# Patient Record
Sex: Female | Born: 1950
Health system: Southern US, Community
[De-identification: ages and names within clinical notes are randomized; demographics above are authoritative.]

## PROBLEM LIST (undated history)

## (undated) DIAGNOSIS — R7989 Other specified abnormal findings of blood chemistry: Secondary | ICD-10-CM

## (undated) DIAGNOSIS — F32A Depression, unspecified: Secondary | ICD-10-CM

## (undated) DIAGNOSIS — F329 Major depressive disorder, single episode, unspecified: Secondary | ICD-10-CM

## (undated) DIAGNOSIS — F419 Anxiety disorder, unspecified: Secondary | ICD-10-CM

## (undated) HISTORY — PX: COLONOSCOPY W/ BIOPSIES AND POLYPECTOMY: SHX1376

## (undated) HISTORY — PX: TONSILLECTOMY: SUR1361

---

## 2000-09-18 ENCOUNTER — Other Ambulatory Visit: Admission: RE | Admit: 2000-09-18 | Discharge: 2000-09-18 | Payer: Self-pay | Admitting: *Deleted

## 2000-10-01 ENCOUNTER — Encounter: Payer: Self-pay | Admitting: Family Medicine

## 2000-10-01 ENCOUNTER — Encounter: Admission: RE | Admit: 2000-10-01 | Discharge: 2000-10-01 | Payer: Self-pay | Admitting: Family Medicine

## 2000-10-02 ENCOUNTER — Encounter: Admission: RE | Admit: 2000-10-02 | Discharge: 2000-10-02 | Payer: Self-pay | Admitting: Family Medicine

## 2000-10-02 ENCOUNTER — Encounter: Payer: Self-pay | Admitting: Family Medicine

## 2000-11-08 ENCOUNTER — Ambulatory Visit (HOSPITAL_COMMUNITY): Admission: RE | Admit: 2000-11-08 | Discharge: 2000-11-08 | Payer: Self-pay | Admitting: Gastroenterology

## 2001-08-16 ENCOUNTER — Emergency Department (HOSPITAL_COMMUNITY): Admission: EM | Admit: 2001-08-16 | Discharge: 2001-08-16 | Payer: Self-pay | Admitting: Emergency Medicine

## 2001-08-28 ENCOUNTER — Encounter: Admission: RE | Admit: 2001-08-28 | Discharge: 2001-08-28 | Payer: Self-pay | Admitting: Occupational Medicine

## 2001-08-28 ENCOUNTER — Encounter: Payer: Self-pay | Admitting: Occupational Medicine

## 2004-05-23 ENCOUNTER — Encounter: Admission: RE | Admit: 2004-05-23 | Discharge: 2004-05-23 | Payer: Self-pay | Admitting: Family Medicine

## 2004-06-09 ENCOUNTER — Other Ambulatory Visit: Admission: RE | Admit: 2004-06-09 | Discharge: 2004-06-09 | Payer: Self-pay | Admitting: Family Medicine

## 2005-09-18 ENCOUNTER — Emergency Department (HOSPITAL_COMMUNITY): Admission: EM | Admit: 2005-09-18 | Discharge: 2005-09-18 | Payer: Self-pay | Admitting: Emergency Medicine

## 2007-07-30 ENCOUNTER — Ambulatory Visit (HOSPITAL_COMMUNITY): Admission: RE | Admit: 2007-07-30 | Discharge: 2007-07-30 | Payer: Self-pay | Admitting: Gastroenterology

## 2007-07-30 ENCOUNTER — Encounter (INDEPENDENT_AMBULATORY_CARE_PROVIDER_SITE_OTHER): Payer: Self-pay | Admitting: Gastroenterology

## 2008-04-16 ENCOUNTER — Emergency Department (HOSPITAL_COMMUNITY): Admission: EM | Admit: 2008-04-16 | Discharge: 2008-04-16 | Payer: Self-pay | Admitting: Emergency Medicine

## 2008-08-26 ENCOUNTER — Ambulatory Visit (HOSPITAL_COMMUNITY): Admission: RE | Admit: 2008-08-26 | Discharge: 2008-08-26 | Payer: Self-pay | Admitting: Gastroenterology

## 2009-07-17 ENCOUNTER — Emergency Department (HOSPITAL_COMMUNITY): Admission: EM | Admit: 2009-07-17 | Discharge: 2009-07-17 | Payer: Self-pay | Admitting: Emergency Medicine

## 2010-12-20 NOTE — Op Note (Signed)
NAME:  Carol Anderson, Carol Anderson           ACCOUNT NO.:  [id number]   MEDICAL RECORD NO.:  [id number]          PATIENT TYPE:  AMB   LOCATION:  ENDO                         FACILITY:  [facility]   PHYSICIAN:  Graylin Shiver, M.D.   DATE OF BIRTH:  12-09-1950   DATE OF PROCEDURE:  07/30/2007  DATE OF DISCHARGE:                               OPERATIVE REPORT   PROCEDURE:  Colonoscopy with polypectomy.   INDICATIONS FOR PROCEDURE:  Rectal bleeding, family history of colon  cancer.   Informed consent was obtained after explanation of the risks of  bleeding, infection and perforation.   PREMEDICATION:  Fentanyl 100 mcg IV, Versed 10 mg IV.   PROCEDURE:  With the patient in the left lateral decubitus position, a  rectal exam was performed.  No masses were felt.  The Pentax colonoscope  was inserted into the rectum and advanced around the colon to the cecum.  Cecal landmarks were identified.  The cecum and ascending colon were  normal.  The transverse colon was normal.  The descending colon and  sigmoid showed a few small diverticula.  In the sigmoid colon there was  a 1 cm pedunculated polyp that was snared and removed by snare cautery  technique.  Polyp was retrieved.  The cautery site looked good.  Also in  the sigmoid colon were two small sessile polyps approximately 5 mm in  size that were snared and removed by snare cautery technique.  The  rectum looked normal.  The scope was retroflexed in the rectum.  No  abnormalities were seen.  She tolerated the procedure well without  complications.   IMPRESSION:  1. Colon polyps.  2. Diverticulosis.   PLAN:  The pathology will be checked.           ______________________________  Graylin Shiver, M.D.     SFG/MEDQ  D:  07/30/2007  T:  07/30/2007  Job:  [id number]   cc:   L. Lupe Carney, M.D.  Bryan Lemma. Manus Gunning, M.D.

## 2010-12-20 NOTE — Op Note (Signed)
NAME:  Carol Anderson, Carol Anderson NO.:  [id number]   MEDICAL RECORD NO.:  [id number]          PATIENT TYPE:  AMB   LOCATION:  ENDO                         FACILITY:  [facility]   PHYSICIAN:  Graylin Shiver, M.D.   DATE OF BIRTH:  10/26/1950   DATE OF PROCEDURE:  08/26/2008  DATE OF DISCHARGE:                               OPERATIVE REPORT   SURGEON:  Graylin Shiver, MD   INDICATION:  History of a cancerous colon polyp, endoscopically resected  in December 2008.   Informed consent was obtained after explanation of the risks of  bleeding, infection, and perforation.   PREMEDICATIONS:  1. Fentanyl 100 mcg IV.  2. Versed 7 mg IV.   PROCEDURE:  With the patient in the left lateral decubitus position, a  rectal exam was performed.  No masses were felt.  The Pentax colonoscope  was inserted into the rectum and advanced around the colon to the cecum.  Cecal landmarks were identified.  The cecum and ascending colon were  normal.  The transverse colon normal.  The descending colon looked  normal.  In the sigmoid, there were a few small diverticula in the  sigmoid, otherwise, looked normal with no evidence of polyps or  recurrence in that area.  The rectum looked normal.  Scope was  retroflexed in the rectum and looked normal.  The scope was removed.  She tolerated the procedure well without complications.   IMPRESSION:  1. Diverticulosis.  2. No evidence of recurring polyps.   RECOMMENDATIONS:  I would recommend repeat colonoscopy in 3 years.           ______________________________  Graylin Shiver, M.D.     SFG/MEDQ  D:  08/26/2008  T:  08/26/2008  Job:  [id number]   cc:   L. Lupe Carney, M.D.  Bryan Lemma. Manus Gunning, M.D.

## 2010-12-23 NOTE — Procedures (Signed)
[place]  Patient:    Carol Anderson, Carol Anderson                  MRN: [id number] Proc. Date: 11/08/00 Adm. Date:  [id number] Attending:  Charna Elizabeth CC:         Katy Fitch, M.D.   Procedure Report  DATE OF BIRTH:  1950-09-12  PROCEDURE PERFORMED:  Colonoscopy.  ENDOSCOPIST:  Anselmo Rod, M.D.  INSTRUMENT USED:  Olympus video colonoscope.  INDICATION FOR PROCEDURE:  Family history of colon cancer in this 60 year old white female, rule out colonic polyps, masses, hemorrhoids, etc.  PREPROCEDURE PREPARATION:  Informed consent was procured from the patient. The patient was fasted for eight hours prior to the procedure and prepped with a bottle of magnesium citrate and a gallon of NuLytely the night prior to the procedure.  PREPROCEDURE PHYSICAL:  VITAL SIGNS:  The patient had stable vital signs.  NECK:  Supple.  CHEST:  Clear to auscultation.  S1 and S2 regular.  ABDOMEN:  Soft with normal abdominal bowel sounds.  DESCRIPTION OF PROCEDURE:  The patient was placed in the left lateral decubitus position and sedated with 50 mg of Demerol and 5 mg of Versed intravenously.  Once the patient was adequately sedated and maintained on low flow oxygen, continuous cardiac monitoring, the Olympus video colonoscope was advanced from the rectum to the cecum without difficulty.  The entire colonic mucosa appeared healthy and without lesions.  No masses, polyps, erosions, ulcerations or diverticula were seen.  IMPRESSION:  Normal colonoscopy.  RECOMMENDATIONS:  Repeat colorectal cancer screening in the next five years or earlier if the patient develops any abdominal symptoms in the interim. DD:  11/09/00 TD:  11/09/00 Job: [id number]

## 2013-04-03 ENCOUNTER — Other Ambulatory Visit (HOSPITAL_COMMUNITY)
Admission: RE | Admit: 2013-04-03 | Discharge: 2013-04-03 | Disposition: A | Discharge status: Home / Self Care | Payer: 59 | Source: Ambulatory Visit | Attending: Obstetrics and Gynecology | Admitting: Obstetrics and Gynecology

## 2013-04-03 ENCOUNTER — Other Ambulatory Visit: Payer: Self-pay | Admitting: Obstetrics and Gynecology

## 2013-04-03 ENCOUNTER — Other Ambulatory Visit: Payer: Self-pay

## 2013-04-03 DIAGNOSIS — Z01419 Encounter for gynecological examination (general) (routine) without abnormal findings: Secondary | ICD-10-CM | POA: Insufficient documentation

## 2013-04-03 DIAGNOSIS — Z1231 Encounter for screening mammogram for malignant neoplasm of breast: Secondary | ICD-10-CM

## 2013-04-03 DIAGNOSIS — Z1151 Encounter for screening for human papillomavirus (HPV): Secondary | ICD-10-CM | POA: Insufficient documentation

## 2013-04-25 ENCOUNTER — Ambulatory Visit
Admission: RE | Admit: 2013-04-25 | Discharge: 2013-04-25 | Disposition: A | Discharge status: Home / Self Care | Payer: 59 | Source: Ambulatory Visit

## 2013-04-25 DIAGNOSIS — Z1231 Encounter for screening mammogram for malignant neoplasm of breast: Secondary | ICD-10-CM

## 2013-04-30 ENCOUNTER — Other Ambulatory Visit: Payer: Self-pay | Admitting: Gastroenterology

## 2013-11-03 ENCOUNTER — Ambulatory Visit
Admission: RE | Admit: 2013-11-03 | Discharge: 2013-11-03 | Disposition: A | Discharge status: Home / Self Care | Payer: 59 | Source: Ambulatory Visit | Attending: Family Medicine | Admitting: Family Medicine

## 2013-11-03 ENCOUNTER — Other Ambulatory Visit: Payer: Self-pay | Admitting: Family Medicine

## 2013-11-03 DIAGNOSIS — M79609 Pain in unspecified limb: Secondary | ICD-10-CM

## 2014-03-26 ENCOUNTER — Emergency Department (HOSPITAL_COMMUNITY): Payer: 59

## 2014-03-26 ENCOUNTER — Encounter (HOSPITAL_COMMUNITY): Payer: Self-pay | Admitting: Emergency Medicine

## 2014-03-26 ENCOUNTER — Emergency Department (HOSPITAL_COMMUNITY)
Admission: EM | Admit: 2014-03-26 | Discharge: 2014-03-27 | Disposition: A | Discharge status: Home / Self Care | Payer: 59 | Attending: Emergency Medicine | Admitting: Emergency Medicine

## 2014-03-26 DIAGNOSIS — G43109 Migraine with aura, not intractable, without status migrainosus: Secondary | ICD-10-CM | POA: Insufficient documentation

## 2014-03-26 DIAGNOSIS — Z8659 Personal history of other mental and behavioral disorders: Secondary | ICD-10-CM | POA: Diagnosis not present

## 2014-03-26 DIAGNOSIS — R209 Unspecified disturbances of skin sensation: Secondary | ICD-10-CM | POA: Diagnosis present

## 2014-03-26 DIAGNOSIS — R200 Anesthesia of skin: Secondary | ICD-10-CM

## 2014-03-26 HISTORY — DX: Other specified abnormal findings of blood chemistry: R79.89

## 2014-03-26 HISTORY — DX: Major depressive disorder, single episode, unspecified: F32.9

## 2014-03-26 HISTORY — DX: Anxiety disorder, unspecified: F41.9

## 2014-03-26 HISTORY — DX: Depression, unspecified: F32.A

## 2014-03-26 LAB — DIFFERENTIAL
Basophils Absolute: 0.1 10*3/uL (ref 0.0–0.1)
Basophils Relative: 1 % (ref 0–1)
Eosinophils Absolute: 0.2 10*3/uL (ref 0.0–0.7)
Eosinophils Relative: 2 % (ref 0–5)
Lymphocytes Relative: 32 % (ref 12–46)
Lymphs Abs: 2.4 10*3/uL (ref 0.7–4.0)
Monocytes Absolute: 0.6 10*3/uL (ref 0.1–1.0)
Monocytes Relative: 8 % (ref 3–12)
Neutro Abs: 4.2 10*3/uL (ref 1.7–7.7)
Neutrophils Relative %: 57 % (ref 43–77)

## 2014-03-26 LAB — CBC
HCT: 42.1 % (ref 36.0–46.0)
Hemoglobin: 13.7 g/dL (ref 12.0–15.0)
MCH: 26 pg (ref 26.0–34.0)
MCHC: 32.5 g/dL (ref 30.0–36.0)
MCV: 79.9 fL (ref 78.0–100.0)
Platelets: 247 10*3/uL (ref 150–400)
RBC: 5.27 MIL/uL — ABNORMAL HIGH (ref 3.87–5.11)
RDW: 15.8 % — ABNORMAL HIGH (ref 11.5–15.5)
WBC: 7.4 10*3/uL (ref 4.0–10.5)

## 2014-03-26 LAB — COMPREHENSIVE METABOLIC PANEL
ALT: 21 U/L (ref 0–35)
AST: 20 U/L (ref 0–37)
Albumin: 3.5 g/dL (ref 3.5–5.2)
Alkaline Phosphatase: 78 U/L (ref 39–117)
Anion gap: 14 (ref 5–15)
BUN: 13 mg/dL (ref 6–23)
CO2: 23 mEq/L (ref 19–32)
Calcium: 9.3 mg/dL (ref 8.4–10.5)
Chloride: 106 mEq/L (ref 96–112)
Creatinine, Ser: 1.01 mg/dL (ref 0.50–1.10)
GFR calc Af Amer: 68 mL/min — ABNORMAL LOW (ref >90)
GFR calc non Af Amer: 59 mL/min — ABNORMAL LOW (ref >90)
Glucose, Bld: 114 mg/dL — ABNORMAL HIGH (ref 70–99)
Potassium: 4.2 mEq/L (ref 3.7–5.3)
Sodium: 143 mEq/L (ref 137–147)
Total Bilirubin: <0.2 mg/dL — ABNORMAL LOW (ref 0.3–1.2)
Total Protein: 6.8 g/dL (ref 6.0–8.3)

## 2014-03-26 LAB — I-STAT CHEM 8, ED
BUN: 14 mg/dL (ref 6–23)
Calcium, Ion: 1.13 mmol/L (ref 1.13–1.30)
Chloride: 109 mEq/L (ref 96–112)
Creatinine, Ser: 1.2 mg/dL — ABNORMAL HIGH (ref 0.50–1.10)
Glucose, Bld: 117 mg/dL — ABNORMAL HIGH (ref 70–99)
HCT: 44 % (ref 36.0–46.0)
Hemoglobin: 15 g/dL (ref 12.0–15.0)
Potassium: 3.9 mEq/L (ref 3.7–5.3)
Sodium: 140 mEq/L (ref 137–147)
TCO2: 25 mmol/L (ref 0–100)

## 2014-03-26 LAB — PROTIME-INR
INR: 0.92 (ref 0.00–1.49)
Prothrombin Time: 12.4 seconds (ref 11.6–15.2)

## 2014-03-26 LAB — ETHANOL: Alcohol, Ethyl (B): <11 mg/dL (ref 0–11)

## 2014-03-26 LAB — APTT: aPTT: 27 seconds (ref 24–37)

## 2014-03-26 LAB — I-STAT TROPONIN, ED: Troponin i, poc: 0 ng/mL (ref 0.00–0.08)

## 2014-03-26 MED ORDER — PROCHLORPERAZINE EDISYLATE 5 MG/ML IJ SOLN
10.0000 mg | Freq: Four times a day (QID) | INTRAMUSCULAR | Status: DC | PRN
  Administered 2014-03-26: 10 mg via INTRAVENOUS
  Filled 2014-03-26: qty 2

## 2014-03-26 MED ORDER — KETOROLAC TROMETHAMINE 15 MG/ML IJ SOLN
15.0000 mg | Freq: Once | INTRAMUSCULAR | Status: AC
  Administered 2014-03-26: 15 mg via INTRAVENOUS
  Filled 2014-03-26: qty 1

## 2014-03-26 MED ORDER — DIPHENHYDRAMINE HCL 50 MG/ML IJ SOLN
25.0000 mg | Freq: Once | INTRAMUSCULAR | Status: AC
  Administered 2014-03-26: 25 mg via INTRAVENOUS
  Filled 2014-03-26: qty 1

## 2014-03-26 NOTE — Code Documentation (Signed)
63 yo wf presenting to triage with one day hx of intermittent HA, dizziness, blurred & double vision that progressed to Lt facial tingling & mild LUE drift.  Pt denies hx of migraines but states her mother has them.  NIH 0 currently & pt denies any visual issues now.  Still with HA.  Plan for MRI.

## 2014-03-26 NOTE — ED Notes (Signed)
Assisted patient to the restroom without any difficulty or incident. 

## 2014-03-26 NOTE — ED Notes (Signed)
Pt presents with blurred vision and headache onset yesterday.  Today at 7pm pt reports numbness and tingling to left side of mouth, facial droop noted to left side- arm drift noted to left arm.  Code stroke called in triage.

## 2014-03-26 NOTE — Consult Note (Signed)
Neurology Consultation Reason for Consult: Left facial numbness Referring Physician: Juleen ChinaKohut, S  CC: Left facial numbness  History is obtained from: Patient  HPI: Carol Anderson is a 63 y.o. female with a history of migraines who presents with headache since yesterday as well as blurred vision and then today she began having some left facial tingling which prompted her to seek care in the emergency room. A code stroke was activated.  CT head was negative.  She reports the tingling is being on left side of her face and associated with numbness as well. She also notes bilateral blurred vision which is common gone over the past day. She has a throbbing headache associated with photophobia. She denies nausea/vomiting. She does have occasional migraines and a family history of migraines.   LKW: 8 AM yesterday tpa given?: no, outside of window, resolve symptoms    ROS: A 14 point ROS was performed and is negative except as noted in the HPI.   Past Medical History  Diagnosis Date  . Depression   . Anxiety   . Low serum vitamin D     Family History: Mother-migraines  Social History: Tob: Denies  Exam: Current vital signs: BP 142/89  Pulse 79  Temp(Src) 98 F (36.7 C) (Oral)  Resp 16  SpO2 94% Vital signs in last 24 hours: Temp:  [98 F (36.7 C)-98.4 F (36.9 C)] 98 F (36.7 C) (08/20 2117) Pulse Rate:  [76-101] 79 (08/20 2200) Resp:  [16-18] 16 (08/20 2100) BP: (142-162)/(58-96) 142/89 mmHg (08/20 2200) SpO2:  [90 %-99 %] 94 % (08/20 2200)  General: In bed, NAD CV: Regular rate and rhythm Mental Status: Patient is awake, alert, oriented to person, place, month, year, and situation. Immediate and remote memory are intact. Patient is able to give a clear and coherent history. No signs of aphasia or neglect Cranial Nerves: II: Visual Fields are full. Pupils are equal, round, and reactive to light.  Discs are difficult to visualize. III,IV, VI: EOMI without ptosis  or diploplia.  V: Facial sensation is symmetric to temperature VII: Facial movement is symmetric.  VIII: hearing is intact to voice X: Uvula elevates symmetrically XI: Shoulder shrug is symmetric. XII: tongue is midline without atrophy or fasciculations.  Motor: Tone is normal. Bulk is normal. 5/5 strength was present in all four extremities.  Sensory: Sensation is symmetric to light touch and temperature in the arms and legs. Deep Tendon Reflexes: 2+ and symmetric in the biceps and patellae.  Plantars: Toes are downgoing bilaterally.  Cerebellar: FNF and HKS are intact bilaterally Gait: Not tested due to the acute nature of evaluation and multiple medical monitors in ED setting.    I have reviewed labs in epic and the results pertinent to this consultation are: CMP, CBC-unremarkable  I have reviewed the images obtained: CT head-unremarkable  Impression: 63 year old female with blurred vision and tingling in the setting of throbbing headache. This is most likely consistent with migraine headache with aura. An MRI could be obtained to confirm this.  Recommendations: 1) MRI brain 2) if negative, would treat his complicated migraine with Compazine plus Toradol 3) no further workup of MRI brain is negative.   Ritta SlotMcNeill Kirkpatrick, MD [facility] [phone]  If 7pm- 7am, please page neurology on call as listed in AMION.

## 2014-03-27 NOTE — Discharge Instructions (Signed)
General Headache Without Cause  A general headache is pain or discomfort felt around the head or neck area. The cause may not be found.   HOME CARE   · Keep all doctor visits.  · Only take medicines as told by your doctor.  · Lie down in a dark, quiet room when you have a headache.  · Keep a journal to find out if certain things bring on headaches. For example, write down:  ¨ What you eat and drink.  ¨ How much sleep you get.  ¨ Any change to your diet or medicines.  · Relax by getting a massage or doing other relaxing activities.  · Put ice or heat packs on the head and neck area as told by your doctor.  · Lessen stress.  · Sit up straight. Do not tighten (tense) your muscles.  · Quit smoking if you smoke.  · Lessen how much alcohol you drink.  · Lessen how much caffeine you drink, or stop drinking caffeine.  · Eat and sleep on a regular schedule.  · Get 7 to 9 hours of sleep, or as told by your doctor.  · Keep lights dim if bright lights bother you or make your headaches worse.  GET HELP RIGHT AWAY IF:   · Your headache becomes really bad.  · You have a fever.  · You have a stiff neck.  · You have trouble seeing.  · Your muscles are weak, or you lose muscle control.  · You lose your balance or have trouble walking.  · You feel like you will pass out (faint), or you pass out.  · You have really bad symptoms that are different than your first symptoms.  · You have problems with the medicines given to you by your doctor.  · Your medicines do not work.  · Your headache feels different than the other headaches.  · You feel sick to your stomach (nauseous) or throw up (vomit).  MAKE SURE YOU:   · Understand these instructions.  · Will watch your condition.  · Will get help right away if you are not doing well or get worse.  Document Released: 05/02/2008 Document Revised: 10/16/2011 Document Reviewed: 07/14/2011  Exit[employer]® Patient Information ©2015 [employer]. This information is not intended to replace advice given to  you by your health care provider. Make sure you discuss any questions you have with your health care provider.

## 2014-04-01 NOTE — ED Provider Notes (Signed)
CSN: [id number]     Arrival date & time 03/26/14  2001 History   First MD Initiated Contact with Patient 03/26/14 2118     Chief Complaint  Patient presents with  . Code Stroke     (Consider location/radiation/quality/duration/timing/severity/associated sxs/prior Treatment) HPI  63 year old female with headache and some blurred vision. Symptom onset yesterday. Relatively constant. Today she developed some new symptoms of numbness and tingling to left side of her mouth felt like she cannot coordinate her left upper extremity normally. These symptoms began around 1900. No fevers or chills. No nausea vomiting. No change in her speech.   Past Medical History  Diagnosis Date  . Depression   . Anxiety   . Low serum vitamin D    Past Surgical History  Procedure Laterality Date  . Colonoscopy w/ biopsies and polypectomy    . Tonsillectomy     History reviewed. No pertinent family history. History  Substance Use Topics  . Smoking status: Never Smoker   . Smokeless tobacco: Never Used  . Alcohol Use: No   OB History   Grav Para Term Preterm Abortions TAB SAB Ect Mult Living                 Review of Systems  All systems reviewed and negative, other than as noted in HPI.  Allergies  Review of patient's allergies indicates not on file.  Home Medications   Prior to Admission medications   Not on File   BP 144/82  Pulse 79  Temp(Src) 98 F (36.7 C) (Oral)  Resp 16  SpO2 94% Physical Exam  Nursing note and vitals reviewed. Constitutional: She is oriented to person, place, and time. She appears well-developed and well-nourished. No distress.  HENT:  Head: Normocephalic and atraumatic.  Eyes: Conjunctivae are normal. Right eye exhibits no discharge. Left eye exhibits no discharge.  Neck: Neck supple.  Cardiovascular: Normal rate, regular rhythm and normal heart sounds.  Exam reveals no gallop and no friction rub.   No murmur heard. Pulmonary/Chest: Effort normal and  breath sounds normal. No respiratory distress.  Abdominal: Soft. She exhibits no distension. There is no tenderness.  Musculoskeletal: She exhibits no edema and no tenderness.  Neurological: She is alert and oriented to person, place, and time. No cranial nerve deficit. She exhibits normal muscle tone. Coordination normal.  Speech clear. Content is appropriate. Cranial nerves are intact. Follows commands. Strength is 5 out of 5 bilateral upper and lower extremities. Good finger to nose testing bilaterally.  Skin: Skin is warm and dry.  Psychiatric: She has a normal mood and affect. Her behavior is normal. Thought content normal.    ED Course  Procedures (including critical care time) Labs Review Labs Reviewed  CBC - Abnormal; Notable for the following:    RBC 5.27 (*)    RDW 15.8 (*)    All other components within normal limits  COMPREHENSIVE METABOLIC PANEL - Abnormal; Notable for the following:    Glucose, Bld 114 (*)    Total Bilirubin <0.2 (*)    GFR calc non Af Amer 59 (*)    GFR calc Af Amer 68 (*)    All other components within normal limits  I-STAT CHEM 8, ED - Abnormal; Notable for the following:    Creatinine, Ser 1.20 (*)    Glucose, Bld 117 (*)    All other components within normal limits  ETHANOL  PROTIME-INR  APTT  DIFFERENTIAL  I-STAT TROPOININ, ED  Rosezena Sensor, ED  Imaging Review No results found.   EKG Interpretation   Date/Time:  Thursday March 26 2014 21:36:57 EDT Ventricular Rate:  79 PR Interval:  180 QRS Duration: 88 QT Interval:  382 QTC Calculation: 438 R Axis:   47 Text Interpretation:  Normal sinus rhythm Cannot rule out Anterior infarct  , age undetermined Abnormal ECG ED PHYSICIAN INTERPRETATION AVAILABLE IN  [facility] Confirmed by TEST, Record ([id number]) on 03/28/2014 11:08:43 AM      MDM   Final diagnoses:  Complicated migraine    63 year old female with headache with an onset neurological symptoms. Evaluated by  neurology. Felt to be complicated migraine. CT and subsequent MRI normal. Symptoms have been improved. Patient discharged no acute distress. Return cautions were discussed.    Raeford Razor, MD 04/01/14 2255

## 2014-07-15 ENCOUNTER — Other Ambulatory Visit (HOSPITAL_COMMUNITY): Payer: Self-pay | Admitting: Family Medicine

## 2014-07-15 DIAGNOSIS — R1012 Left upper quadrant pain: Secondary | ICD-10-CM

## 2014-07-16 ENCOUNTER — Encounter (HOSPITAL_COMMUNITY): Payer: Self-pay

## 2014-07-16 ENCOUNTER — Ambulatory Visit (HOSPITAL_COMMUNITY)
Admission: RE | Admit: 2014-07-16 | Discharge: 2014-07-16 | Disposition: A | Discharge status: Home / Self Care | Payer: 59 | Source: Ambulatory Visit | Attending: Family Medicine | Admitting: Family Medicine

## 2014-07-16 DIAGNOSIS — R1012 Left upper quadrant pain: Secondary | ICD-10-CM | POA: Insufficient documentation

## 2014-07-16 MED ORDER — IOHEXOL 300 MG/ML  SOLN
100.0000 mL | Freq: Once | INTRAMUSCULAR | Status: AC | PRN
  Administered 2014-07-16: 100 mL via INTRAVENOUS

## 2014-07-17 ENCOUNTER — Ambulatory Visit (HOSPITAL_COMMUNITY): Payer: 59

## 2015-06-25 ENCOUNTER — Other Ambulatory Visit: Payer: Self-pay

## 2015-06-25 DIAGNOSIS — Z1231 Encounter for screening mammogram for malignant neoplasm of breast: Secondary | ICD-10-CM

## 2015-07-14 ENCOUNTER — Ambulatory Visit
Admission: RE | Admit: 2015-07-14 | Discharge: 2015-07-14 | Disposition: A | Discharge status: Home / Self Care | Payer: 59 | Source: Ambulatory Visit

## 2015-07-14 DIAGNOSIS — Z1231 Encounter for screening mammogram for malignant neoplasm of breast: Secondary | ICD-10-CM

## 2015-07-23 ENCOUNTER — Other Ambulatory Visit (HOSPITAL_COMMUNITY)
Admission: RE | Admit: 2015-07-23 | Discharge: 2015-07-23 | Disposition: A | Discharge status: Home / Self Care | Payer: 59 | Source: Ambulatory Visit | Attending: Obstetrics and Gynecology | Admitting: Obstetrics and Gynecology

## 2015-07-23 ENCOUNTER — Other Ambulatory Visit: Payer: Self-pay | Admitting: Obstetrics and Gynecology

## 2015-07-23 DIAGNOSIS — Z01419 Encounter for gynecological examination (general) (routine) without abnormal findings: Secondary | ICD-10-CM | POA: Insufficient documentation

## 2015-07-23 DIAGNOSIS — N760 Acute vaginitis: Secondary | ICD-10-CM | POA: Diagnosis present

## 2015-07-27 LAB — CYTOLOGY - PAP

## 2015-07-28 ENCOUNTER — Other Ambulatory Visit: Payer: Self-pay | Admitting: Gastroenterology

## 2015-08-17 DIAGNOSIS — F3341 Major depressive disorder, recurrent, in partial remission: Secondary | ICD-10-CM | POA: Diagnosis not present

## 2015-09-06 DIAGNOSIS — M859 Disorder of bone density and structure, unspecified: Secondary | ICD-10-CM | POA: Diagnosis not present

## 2015-09-06 DIAGNOSIS — M8589 Other specified disorders of bone density and structure, multiple sites: Secondary | ICD-10-CM | POA: Diagnosis not present

## 2015-09-06 MED FILL — PARoxetine HCL 30 MG TABS: 30 | 90 days supply | Qty: 90 | Fill #0

## 2015-09-06 MED FILL — traZODone HCL 50 MG TABS: 50 | 90 days supply | Qty: 270 | Fill #0

## 2015-09-06 MED FILL — ALPRAZolam 0.5 MG TABS: 0.5 | 30 days supply | Qty: 120 | Fill #0

## 2015-10-06 MED FILL — ALPRAZolam 0.5 MG TABS: 0.5 | 30 days supply | Qty: 120 | Fill #1

## 2016-05-18 IMAGING — MG MM SCREEN MAMMOGRAM BILATERAL
4 series · 4 of 4 positions shown · non-contrast
Comparison: Previous exam(s).

CLINICAL DATA: Screening.

EXAM:
DIGITAL SCREENING BILATERAL MAMMOGRAM WITH CAD

[R CC]
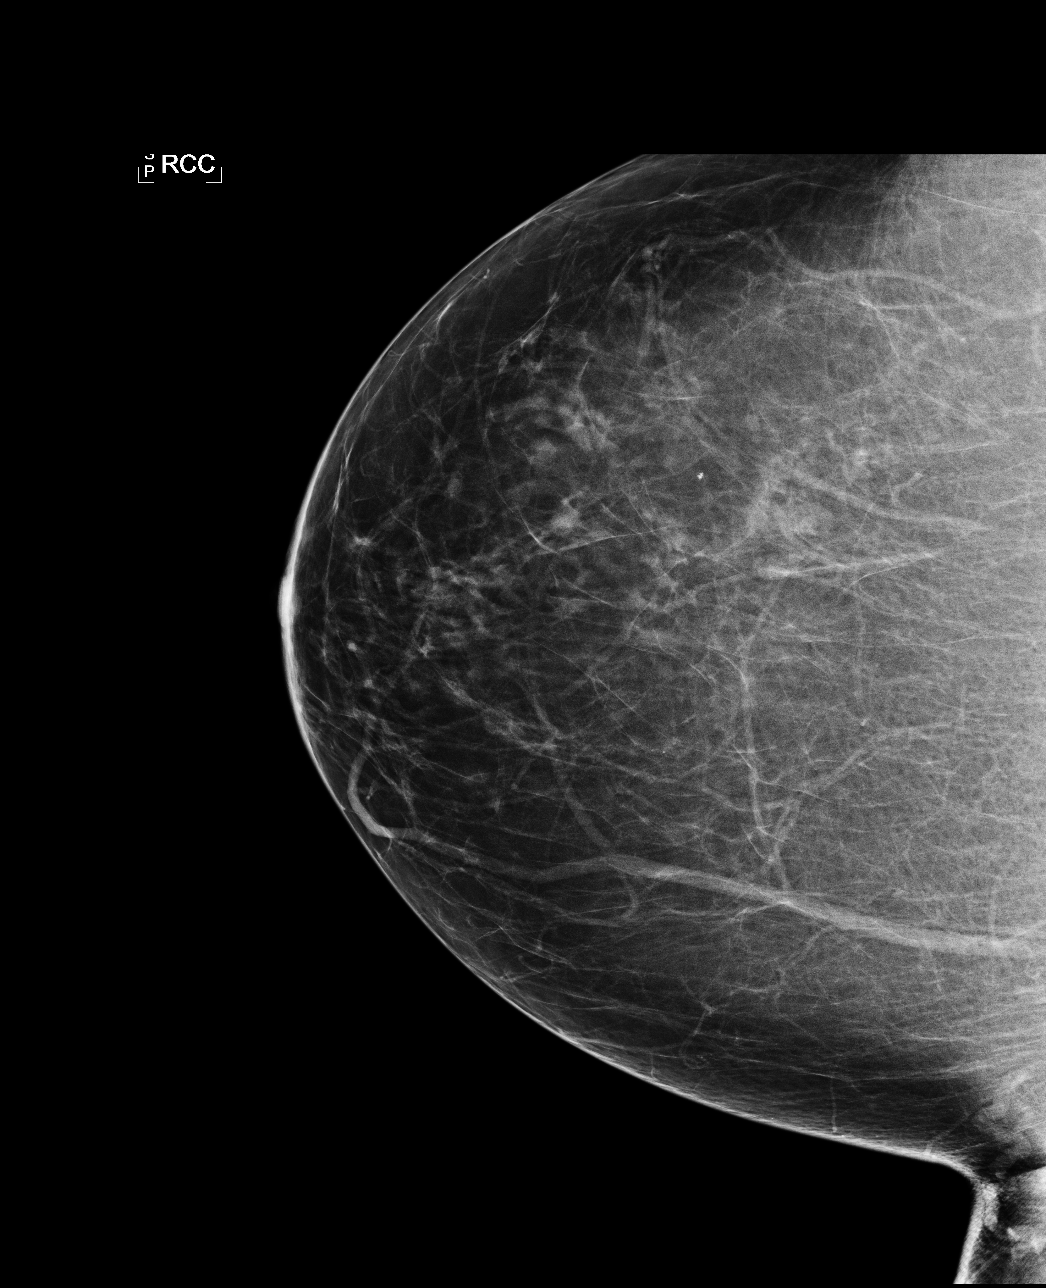

[L CC]
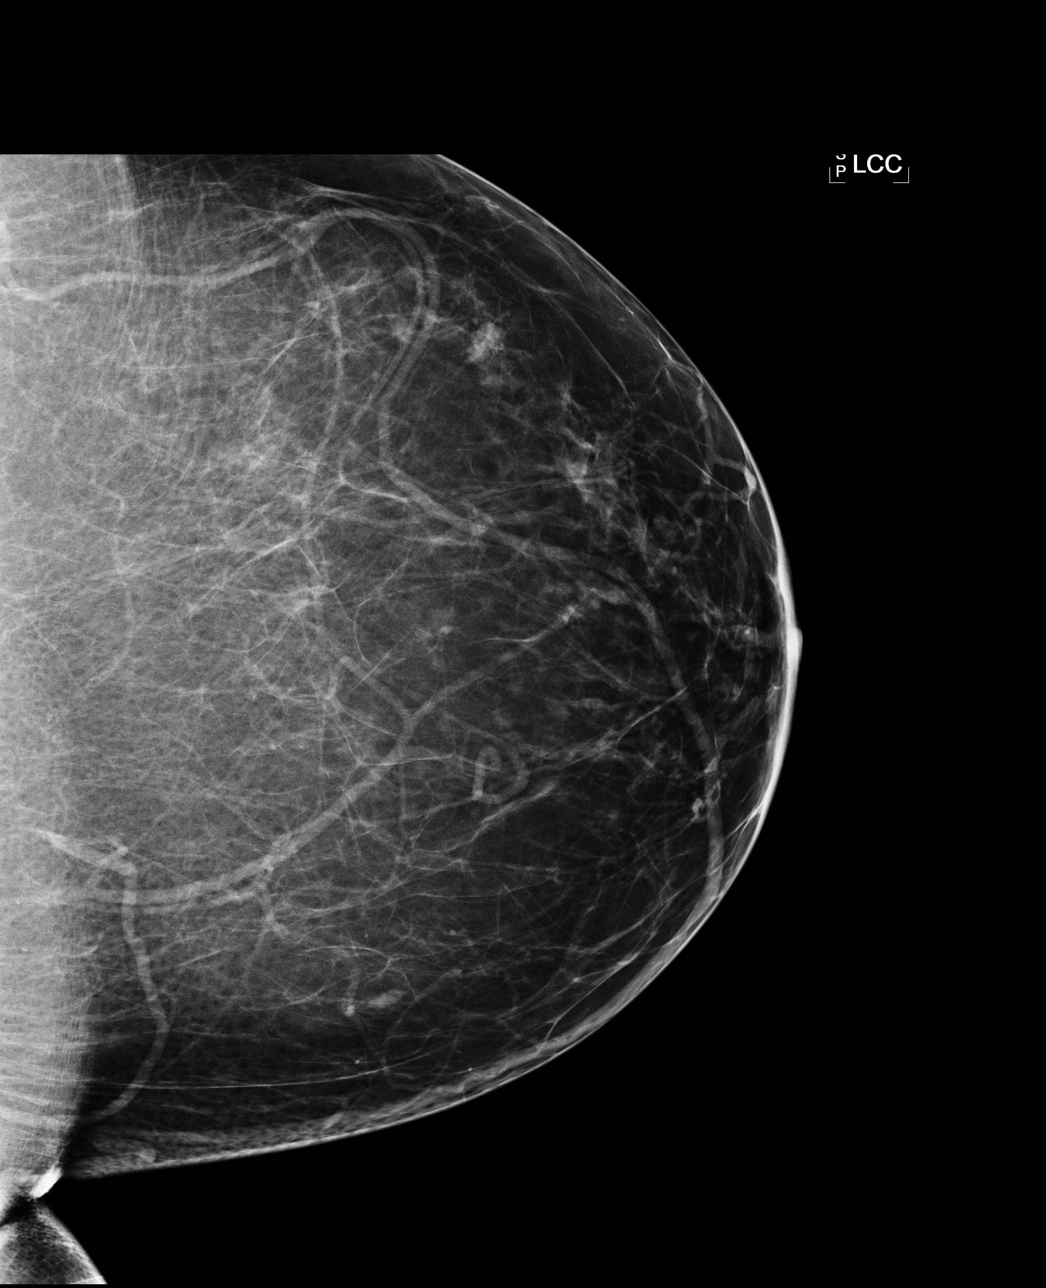

[L MLO]
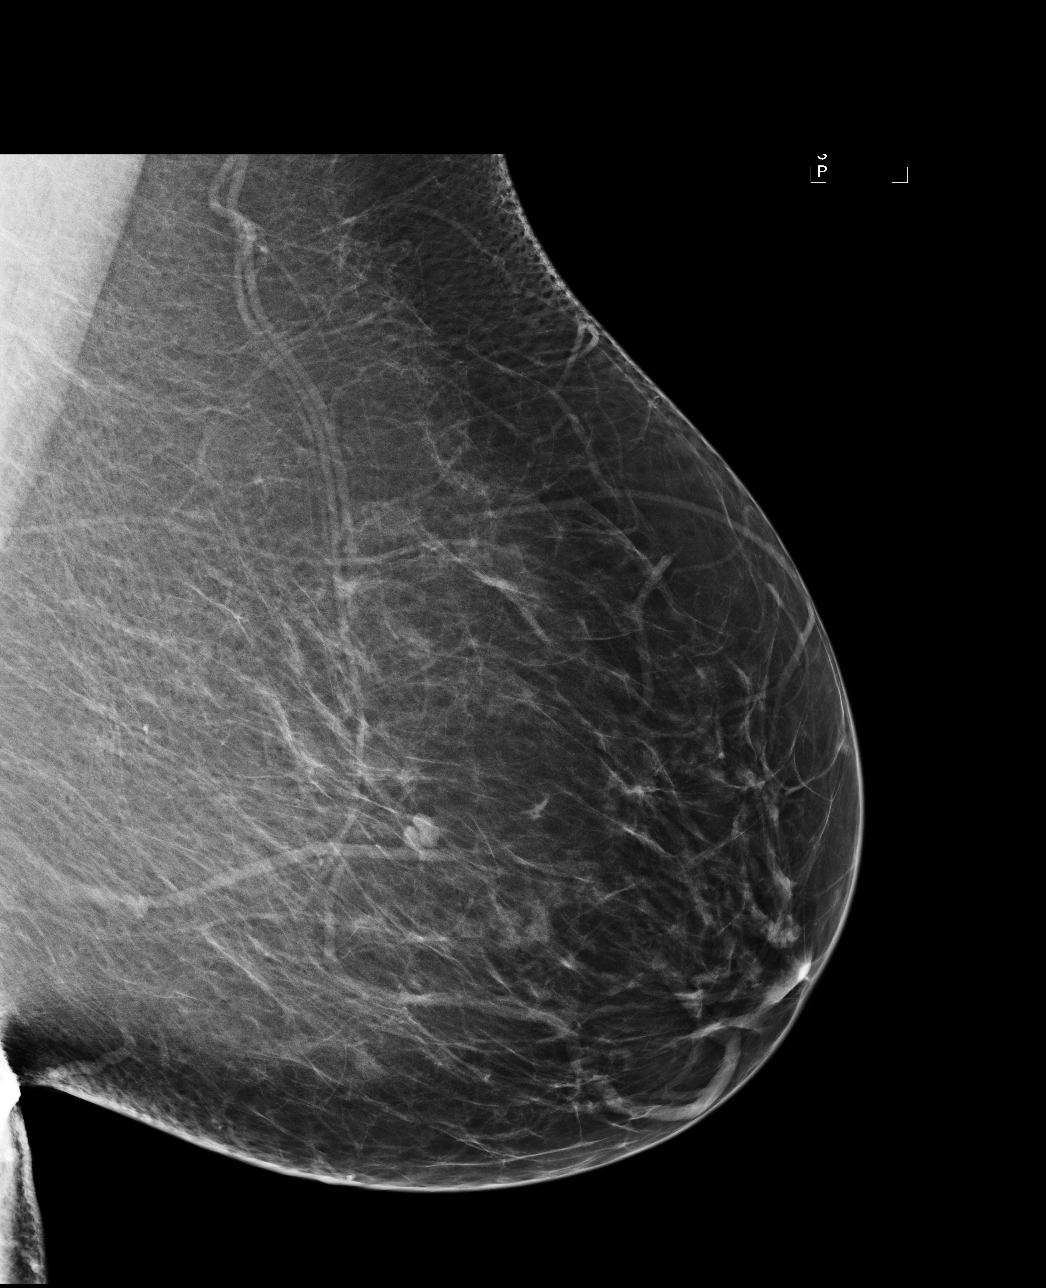

[R MLO]
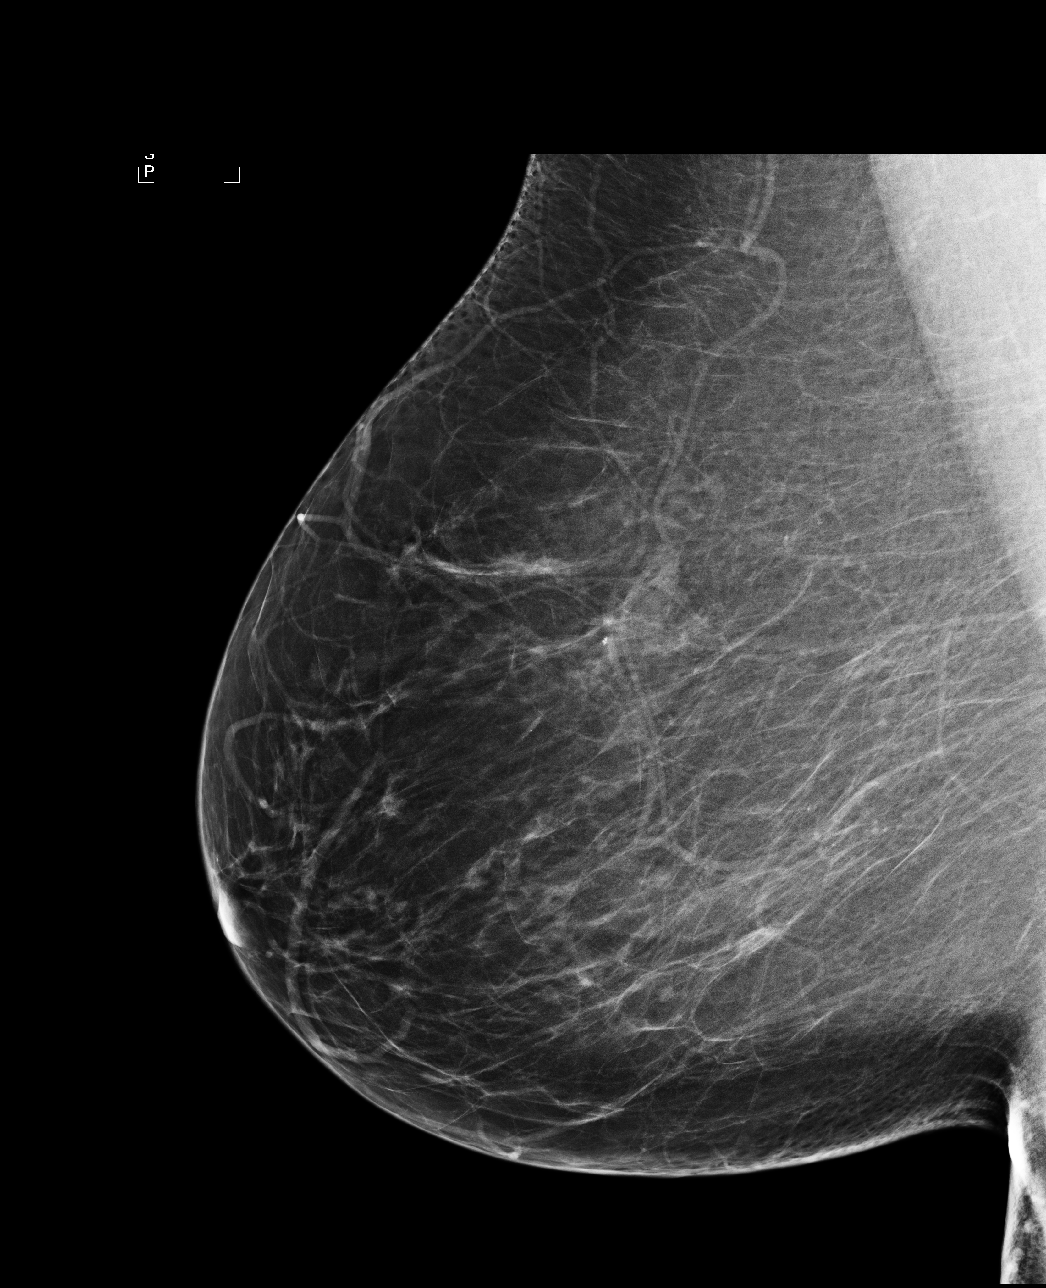

[4 of 4 positions shown; findings below may reference images not displayed]

ACR Breast Density Category b: There are scattered areas of
fibroglandular density.
FINDINGS: There are no findings suspicious for malignancy. Images were
processed with CAD.
IMPRESSION: No mammographic evidence of malignancy. A result letter of this
screening mammogram will be mailed directly to the patient.

RECOMMENDATION:
Screening mammogram in one year. (Code:AS-G-LCT)

BI-RADS CATEGORY  1: Negative.
